# Patient Record
Sex: Female | Born: 1985 | Race: Black or African American | Hispanic: No | Marital: Single | Smoking: Never smoker
Health system: Southern US, Community
[De-identification: ages and names within clinical notes are randomized; demographics above are authoritative.]

## PROBLEM LIST (undated history)

## (undated) DIAGNOSIS — Z789 Other specified health status: Secondary | ICD-10-CM

## (undated) DIAGNOSIS — O24419 Gestational diabetes mellitus in pregnancy, unspecified control: Secondary | ICD-10-CM

## (undated) HISTORY — PX: BILATERAL SALPINGECTOMY: SHX5743

---

## 2019-09-22 ENCOUNTER — Emergency Department (HOSPITAL_COMMUNITY)
Admission: EM | Admit: 2019-09-22 | Discharge: 2019-09-23 | Disposition: A | Discharge status: Home / Self Care | Payer: Medicaid - Out of State | Attending: Emergency Medicine | Admitting: Emergency Medicine

## 2019-09-22 ENCOUNTER — Other Ambulatory Visit: Payer: Self-pay

## 2019-09-22 ENCOUNTER — Emergency Department (HOSPITAL_COMMUNITY)
Admission: EM | Admit: 2019-09-22 | Discharge: 2019-09-22 | Disposition: A | Discharge status: Home / Self Care | Payer: Medicaid - Out of State

## 2019-09-22 ENCOUNTER — Ambulatory Visit (HOSPITAL_COMMUNITY): Admission: EM | Admit: 2019-09-22 | Discharge: 2019-09-22 | Discharge status: Against Medical Advice | Payer: Self-pay

## 2019-09-22 ENCOUNTER — Encounter (HOSPITAL_COMMUNITY): Payer: Self-pay | Admitting: Emergency Medicine

## 2019-09-22 DIAGNOSIS — M545 Low back pain, unspecified: Secondary | ICD-10-CM

## 2019-09-22 DIAGNOSIS — R110 Nausea: Secondary | ICD-10-CM

## 2019-09-22 DIAGNOSIS — R0981 Nasal congestion: Secondary | ICD-10-CM | POA: Insufficient documentation

## 2019-09-22 NOTE — ED Triage Notes (Signed)
Pt endorses N/V and HA today. Denies abd pain. Able to tolerate PO.

## 2019-09-23 LAB — URINALYSIS, ROUTINE W REFLEX MICROSCOPIC
Bacteria, UA: NONE SEEN
Bilirubin Urine: NEGATIVE
Glucose, UA: NEGATIVE mg/dL
Ketones, ur: NEGATIVE mg/dL
Leukocytes,Ua: NEGATIVE
Nitrite: NEGATIVE
Protein, ur: NEGATIVE mg/dL
RBC / HPF: >50 RBC/hpf — ABNORMAL HIGH (ref 0–5)
Specific Gravity, Urine: 1.025 (ref 1.005–1.030)
pH: 5 (ref 5.0–8.0)

## 2019-09-23 LAB — POC URINE PREG, ED: Preg Test, Ur: NEGATIVE

## 2019-09-23 MED ORDER — ONDANSETRON 4 MG PO TBDP: 4.0000 mg | ORAL_TABLET | Freq: Three times a day (TID) | ORAL | 0 refills | Status: AC | PRN

## 2019-09-23 MED ORDER — FLUTICASONE PROPIONATE 50 MCG/ACT NA SUSP: 1.0000 | Freq: Every day | NASAL | 0 refills | Status: AC

## 2019-09-23 MED ORDER — ACETAMINOPHEN 500 MG PO TABS
1000.0000 mg | ORAL_TABLET | Freq: Once | ORAL | Status: AC
  Administered 2019-09-23: 01:00:00 1000 mg via ORAL
  Filled 2019-09-23: qty 2

## 2019-09-23 MED ORDER — METHOCARBAMOL 500 MG PO TABS: 500.0000 mg | ORAL_TABLET | Freq: Two times a day (BID) | ORAL | 0 refills | Status: DC | PRN

## 2019-09-23 MED ORDER — ONDANSETRON 4 MG PO TBDP
4.0000 mg | ORAL_TABLET | Freq: Once | ORAL | Status: AC
  Administered 2019-09-23: 01:00:00 4 mg via ORAL
  Filled 2019-09-23: qty 1

## 2019-09-23 NOTE — ED Notes (Signed)
Tolerated PO challenge.

## 2019-09-23 NOTE — ED Provider Notes (Signed)
[name] EMERGENC[facility] DEPARTMENT Provider Note   CSN: [id number] Arrival date & time: 09/22/19  1745     History Chief Complaint  Patient presents with  . Nausea    Sheri Stout is a 34 y.o. female.  The history is provided by the patient and medical records.   34 year old with nausea, headache, and low back pain.  Symptoms ongoing for the past week or so. States headache is dull in the middle of her forehead.  She denies any dizziness, confusion, numbness or weakness.  She has not had any fever, chills, or neck pain.  She was seen at urgent care last week for same with negative COVID test.  She was not given any medications for congestion.  Back pain is more in her lower back, very vague in description.  She has not had any radiation into the legs.  No bowel or bladder incontinence.  Does report doing a lot of bending and lifting at work which may be contributing.  She denies any current urinary symptoms but does report history of frequent UTI's. No pelvic pain or vaginal discharge. Denies sick contacts, changes in diet, abnormal food intake, or other source of nausea.  She has been able to tolerate food and fluids today.  Denies abdominal pain.  No meds PTA.  History reviewed. No pertinent past medical history.  There are no problems to display for this patient.   History reviewed. No pertinent surgical history.   OB History   No obstetric history on file.     No family history on file.  Social History   Tobacco Use  . Smoking status: Not on file  Substance Use Topics  . Alcohol use: Not on file  . Drug use: Not on file    Home Medications Prior to Admission medications   Not on File    Allergies    Patient has no allergy information on record.  Review of Systems   Review of Systems  Gastrointestinal: Positive for nausea.  All other systems reviewed and are negative.   Physical Exam Updated Vital Signs BP 108/73 (BP Location: Left Arm)    Pulse 78   Temp 98.1 F (36.7 C) (Oral)   Resp 18   Ht 5\' 6"  (1.676 m)   Wt 79.4 kg   LMP 09/22/2019   SpO2 99%   BMI 28.25 kg/m   Physical Exam Vitals and nursing note reviewed.  Constitutional:      Appearance: She is well-developed.     Comments: Clinically appears well  HENT:     Head: Normocephalic and atraumatic.     Nose: Congestion present.     Comments: Sounds congested Eyes:     Conjunctiva/sclera: Conjunctivae normal.     Pupils: Pupils are equal, round, and reactive to light.  Cardiovascular:     Rate and Rhythm: Normal rate and regular rhythm.     Heart sounds: Normal heart sounds.  Pulmonary:     Effort: Pulmonary effort is normal.     Breath sounds: Normal breath sounds.  Abdominal:     General: Bowel sounds are normal.     Palpations: Abdomen is soft.     Tenderness: There is no abdominal tenderness. There is no guarding or rebound.     Comments: Soft, non-tender, no CVA tenderness  Musculoskeletal:        General: Normal range of motion.     Cervical back: Normal range of motion.     Comments: No midline  tenderness or step-off of the lumbar spine, no muscular tenderness or spasm appreciated, full ROM maintained, normal strength/sensation of both legs, normal gait  Skin:    General: Skin is warm and dry.  Neurological:     Mental Status: She is alert and oriented to person, place, and time.     ED Results / Procedures / Treatments   Labs (all labs ordered are listed, but only abnormal results are displayed) Labs Reviewed  URINALYSIS, ROUTINE W REFLEX MICROSCOPIC - Abnormal; Notable for the following components:      Result Value   APPearance HAZY (*)    Hgb urine dipstick LARGE (*)    RBC / HPF >50 (*)    All other components within normal limits  POC URINE PREG, ED    EKG None  Radiology No results found.  Procedures Procedures (including critical care time)  Medications Ordered in ED Medications  ondansetron (ZOFRAN-ODT)  disintegrating tablet 4 mg (4 mg Oral Given 09/23/19 0117)  acetaminophen (TYLENOL) tablet 1,000 mg (1,000 mg Oral Given 09/23/19 0117)    ED Course  I have reviewed the triage vital signs and the nursing notes.  Pertinent labs & imaging results that were available during my care of the patient were reviewed by me and considered in my medical decision making (see chart for details).    MDM Rules/Calculators/A&P  34 year old female here with several complaints.  1.  Nausea and headache-- ongoing for the past week or so.  Seen at urgent care and had covid test that was negative.  Reports some nasal congestion with this.  Denies fever or further sick contacts.  Does have some congestion on exam but lungs are clear bilaterally.  No active emesis observed while here in the waiting room for past several hours.  Given dose of zofran and tylenol.  2.  Back pain-- ongoing for a few days.  She did recently move to [place] from [place] and her job is physical requiring a lot of bending and lifting.  No focal neurologic deficits observed here.  She is ambulatory with steady gait.  No red flag symptoms.  Denies any urinary symptoms, pelvic pain, or abdominal pain.  Does have history of recurrent UTI.  UA here with some blood but no bacteria.  Pregnancy test is negative.  Likely musculoskeletal.  1:53 AM Nausea is significantly better after Zofran.  She has been able to tolerate 2 cups of water without issue.  She not had any active emesis here in the emergency department.  Feel she is stable for discharge home.  Will prescribe some medications to help with congestion as well as back pain.  She will need to follow-up closely with primary care doctor who is still in [place].  Given work note.  She will return here for any new or acute changes.   Final Clinical Impression(s) / ED Diagnoses Final diagnoses:  Nausea  Nasal congestion  Acute low back pain without sciatica, unspecified back pain laterality    Rx / DC  Orders ED Discharge Orders         Ordered    fluticasone (FLONASE) 50 MCG/ACT nasal spray  Daily     09/23/19 0154    methocarbamol (ROBAXIN) 500 MG tablet  2 times daily PRN     09/23/19 0154    ondansetron (ZOFRAN ODT) 4 MG disintegrating tablet  Every 8 hours PRN     09/23/19 0154           Sharilyn Sites  Blair Heys 09/23/19 [id number]    Shon Baton, MD 09/23/19 [phone]

## 2019-09-23 NOTE — ED Notes (Signed)
Pt given water 

## 2019-09-23 NOTE — Discharge Instructions (Signed)
Take the prescribed medication as directed.  Make sure to stay hydrated. Follow-up with your primary care doctor. Return to the ED for new or worsening symptoms.

## 2019-09-23 NOTE — ED Notes (Signed)
Verbalized understanding of DC instructions, Rx, follow up care 

## 2020-07-23 ENCOUNTER — Encounter (HOSPITAL_COMMUNITY): Payer: Self-pay

## 2020-07-23 ENCOUNTER — Other Ambulatory Visit: Payer: Self-pay

## 2020-07-23 ENCOUNTER — Emergency Department (HOSPITAL_COMMUNITY): Payer: Medicaid - Out of State

## 2020-07-23 ENCOUNTER — Emergency Department (HOSPITAL_COMMUNITY)
Admission: EM | Admit: 2020-07-23 | Discharge: 2020-07-23 | Disposition: A | Discharge status: Home / Self Care | Payer: Medicaid - Out of State | Attending: Emergency Medicine | Admitting: Emergency Medicine

## 2020-07-23 DIAGNOSIS — S22049A Unspecified fracture of fourth thoracic vertebra, initial encounter for closed fracture: Secondary | ICD-10-CM | POA: Diagnosis not present

## 2020-07-23 DIAGNOSIS — S29002A Unspecified injury of muscle and tendon of back wall of thorax, initial encounter: Secondary | ICD-10-CM | POA: Diagnosis present

## 2020-07-23 DIAGNOSIS — R519 Headache, unspecified: Secondary | ICD-10-CM | POA: Insufficient documentation

## 2020-07-23 DIAGNOSIS — M25521 Pain in right elbow: Secondary | ICD-10-CM | POA: Insufficient documentation

## 2020-07-23 DIAGNOSIS — Y92410 Unspecified street and highway as the place of occurrence of the external cause: Secondary | ICD-10-CM | POA: Diagnosis not present

## 2020-07-23 LAB — I-STAT BETA HCG BLOOD, ED (MC, WL, AP ONLY): I-stat hCG, quantitative: <5 m[IU]/mL (ref <5)

## 2020-07-23 MED ORDER — NAPROXEN 500 MG PO TABS: 500.0000 mg | ORAL_TABLET | Freq: Two times a day (BID) | ORAL | 0 refills | Status: DC

## 2020-07-23 MED ORDER — NAPROXEN 500 MG PO TABS: 500.0000 mg | ORAL_TABLET | Freq: Two times a day (BID) | ORAL | 0 refills | Status: AC

## 2020-07-23 MED ORDER — METHOCARBAMOL 500 MG PO TABS: 500.0000 mg | ORAL_TABLET | Freq: Two times a day (BID) | ORAL | 0 refills | Status: DC

## 2020-07-23 MED ORDER — OXYCODONE-ACETAMINOPHEN 5-325 MG PO TABS
1.0000 | ORAL_TABLET | Freq: Once | ORAL | Status: AC
  Administered 2020-07-23: 1 via ORAL
  Filled 2020-07-23: qty 1

## 2020-07-23 MED ORDER — HYDROCODONE-ACETAMINOPHEN 5-325 MG PO TABS: 1.0000 | ORAL_TABLET | Freq: Four times a day (QID) | ORAL | 0 refills | Status: DC | PRN

## 2020-07-23 MED ORDER — LIDOCAINE 5 % EX PTCH
1.0000 | MEDICATED_PATCH | CUTANEOUS | Status: DC
  Administered 2020-07-23: 1 via TRANSDERMAL
  Filled 2020-07-23: qty 1
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
## 2020-07-23 NOTE — Progress Notes (Signed)
Orthopedic Tech Progress Note Patient Details:  Sheri Stout 1985-11-14 [id number]  Ortho Devices Type of Ortho Device: Sling and swathe Ortho Device/Splint Location: right Ortho Device/Splint Interventions: Application   Post Interventions Patient Tolerated: Well Instructions Provided: Care of device   Saul Fordyce 07/23/2020, 7:17 PM
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
## 2020-07-23 NOTE — ED Notes (Signed)
Patient left before discharge process could be completed.  

## 2020-07-24 ENCOUNTER — Emergency Department (HOSPITAL_COMMUNITY)
Admission: EM | Admit: 2020-07-24 | Discharge: 2020-07-24 | Disposition: A | Discharge status: Home / Self Care | Payer: Medicaid - Out of State | Attending: Emergency Medicine | Admitting: Emergency Medicine

## 2020-07-24 ENCOUNTER — Encounter (HOSPITAL_COMMUNITY): Payer: Self-pay | Admitting: Emergency Medicine

## 2020-07-24 ENCOUNTER — Other Ambulatory Visit: Payer: Self-pay

## 2020-07-24 ENCOUNTER — Emergency Department (HOSPITAL_COMMUNITY): Payer: Medicaid - Out of State

## 2020-07-24 DIAGNOSIS — M542 Cervicalgia: Secondary | ICD-10-CM | POA: Insufficient documentation

## 2020-07-24 DIAGNOSIS — R0789 Other chest pain: Secondary | ICD-10-CM | POA: Diagnosis not present

## 2020-07-24 DIAGNOSIS — S161XXA Strain of muscle, fascia and tendon at neck level, initial encounter: Secondary | ICD-10-CM

## 2020-07-24 DIAGNOSIS — R519 Headache, unspecified: Secondary | ICD-10-CM | POA: Insufficient documentation

## 2020-07-24 DIAGNOSIS — Y92410 Unspecified street and highway as the place of occurrence of the external cause: Secondary | ICD-10-CM | POA: Insufficient documentation

## 2020-07-24 DIAGNOSIS — R42 Dizziness and giddiness: Secondary | ICD-10-CM | POA: Diagnosis not present

## 2020-07-24 DIAGNOSIS — S060X0A Concussion without loss of consciousness, initial encounter: Secondary | ICD-10-CM

## 2020-07-24 MED ORDER — KETOROLAC TROMETHAMINE 60 MG/2ML IM SOLN
60.0000 mg | Freq: Once | INTRAMUSCULAR | Status: AC
  Administered 2020-07-24: 60 mg via INTRAMUSCULAR
  Filled 2020-07-24: qty 2

## 2020-07-24 MED ORDER — MORPHINE SULFATE (PF) 4 MG/ML IV SOLN
4.0000 mg | Freq: Once | INTRAVENOUS | Status: AC
  Administered 2020-07-24: 4 mg via INTRAMUSCULAR
  Filled 2020-07-24: qty 1

## 2020-07-24 MED ORDER — CYCLOBENZAPRINE HCL 10 MG PO TABS
5.0000 mg | ORAL_TABLET | Freq: Once | ORAL | Status: AC
  Administered 2020-07-24: 5 mg via ORAL
  Filled 2020-07-24: qty 1

## 2020-07-24 NOTE — Discharge Instructions (Addendum)
You can continue taking your Vicodin and Flexeril as prescribed yesterday.  Your CT head and neck were unremarkable today.  You likely just have concussion and neck strain.  Please follow-up with Dr. Jake Samples from neurosurgery as discussed yesterday   Return to ER if you have worse neck pain, headaches, vomiting
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
## 2020-08-02 ENCOUNTER — Other Ambulatory Visit: Payer: Self-pay

## 2020-08-02 ENCOUNTER — Encounter (HOSPITAL_COMMUNITY): Payer: Self-pay

## 2020-08-02 ENCOUNTER — Emergency Department (HOSPITAL_COMMUNITY)
Admission: EM | Admit: 2020-08-02 | Discharge: 2020-08-03 | Disposition: A | Discharge status: Home / Self Care | Payer: Medicaid - Out of State | Attending: Emergency Medicine | Admitting: Emergency Medicine

## 2020-08-02 DIAGNOSIS — S22040D Wedge compression fracture of fourth thoracic vertebra, subsequent encounter for fracture with routine healing: Secondary | ICD-10-CM | POA: Insufficient documentation

## 2020-08-02 DIAGNOSIS — M549 Dorsalgia, unspecified: Secondary | ICD-10-CM | POA: Diagnosis present

## 2020-08-02 DIAGNOSIS — S22048D Other fracture of fourth thoracic vertebra, subsequent encounter for fracture with routine healing: Secondary | ICD-10-CM

## 2020-08-02 DIAGNOSIS — M62830 Muscle spasm of back: Secondary | ICD-10-CM
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
## 2020-08-03 MED ORDER — METHOCARBAMOL 500 MG PO TABS: 500.0000 mg | ORAL_TABLET | Freq: Two times a day (BID) | ORAL | 0 refills | Status: AC

## 2020-08-03 MED ORDER — KETOROLAC TROMETHAMINE 60 MG/2ML IM SOLN
30.0000 mg | Freq: Once | INTRAMUSCULAR | Status: AC
  Administered 2020-08-03: 30 mg via INTRAMUSCULAR
  Filled 2020-08-03: qty 2

## 2020-08-03 NOTE — Discharge Instructions (Addendum)
You may use over-the-counter Acetaminophen (Tylenol)[1000mg  every 8 hours], topical muscle creams such as SalonPas, [employer], Bengay, etc. Please stretch, apply ice or heat (whichever helps), and have massage therapy for additional assistance.
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
## 2020-12-11 ENCOUNTER — Emergency Department (HOSPITAL_COMMUNITY): Payer: Medicaid - Out of State

## 2020-12-11 ENCOUNTER — Ambulatory Visit (HOSPITAL_COMMUNITY)
Admission: EM | Admit: 2020-12-11 | Discharge: 2020-12-11 | Disposition: A | Discharge status: Home / Self Care | Payer: Medicaid - Out of State

## 2020-12-11 ENCOUNTER — Emergency Department (HOSPITAL_COMMUNITY)
Admission: EM | Admit: 2020-12-11 | Discharge: 2020-12-12 | Disposition: A | Discharge status: Home / Self Care | Payer: Medicaid - Out of State | Attending: Emergency Medicine | Admitting: Emergency Medicine

## 2020-12-11 ENCOUNTER — Other Ambulatory Visit: Payer: Self-pay

## 2020-12-11 ENCOUNTER — Encounter (HOSPITAL_COMMUNITY): Payer: Self-pay | Admitting: Emergency Medicine

## 2020-12-11 DIAGNOSIS — F17210 Nicotine dependence, cigarettes, uncomplicated: Secondary | ICD-10-CM | POA: Diagnosis not present

## 2020-12-11 DIAGNOSIS — Z23 Encounter for immunization: Secondary | ICD-10-CM | POA: Diagnosis not present

## 2020-12-11 DIAGNOSIS — S71111A Laceration without foreign body, right thigh, initial encounter: Secondary | ICD-10-CM | POA: Insufficient documentation

## 2020-12-11 DIAGNOSIS — W268XXA Contact with other sharp object(s), not elsewhere classified, initial encounter: Secondary | ICD-10-CM | POA: Insufficient documentation

## 2020-12-11 DIAGNOSIS — S79921A Unspecified injury of right thigh, initial encounter: Secondary | ICD-10-CM | POA: Diagnosis present

## 2020-12-11 DIAGNOSIS — T148XXA Other injury of unspecified body region, initial encounter: Secondary | ICD-10-CM

## 2020-12-11 MED ORDER — TETANUS-DIPHTH-ACELL PERTUSSIS 5-2.5-18.5 LF-MCG/0.5 IM SUSY
0.5000 mL | PREFILLED_SYRINGE | Freq: Once | INTRAMUSCULAR | Status: AC
  Administered 2020-12-12: 0.5 mL via INTRAMUSCULAR
  Filled 2020-12-11: qty 0.5

## 2020-12-11 MED ORDER — OXYCODONE-ACETAMINOPHEN 5-325 MG PO TABS
1.0000 | ORAL_TABLET | Freq: Once | ORAL | Status: AC
  Administered 2020-12-11: 1 via ORAL
  Filled 2020-12-11: qty 1

## 2020-12-11 MED ORDER — IBUPROFEN 400 MG PO TABS
400.0000 mg | ORAL_TABLET | Freq: Once | ORAL | Status: AC
  Administered 2020-12-11: 400 mg via ORAL
  Filled 2020-12-11: qty 1
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
## 2020-12-11 NOTE — ED Notes (Signed)
Pt states she is stepping outside for fresh air
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
## 2020-12-12 MED ORDER — LIDOCAINE HCL (PF) 1 % IJ SOLN
30.0000 mL | Freq: Once | INTRAMUSCULAR | Status: AC
  Administered 2020-12-12: 30 mL
  Filled 2020-12-12: qty 30

## 2020-12-12 MED ORDER — OXYCODONE-ACETAMINOPHEN 5-325 MG PO TABS
1.0000 | ORAL_TABLET | Freq: Once | ORAL | Status: AC
  Administered 2020-12-12: 1 via ORAL
  Filled 2020-12-12: qty 1
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
## 2020-12-12 NOTE — Discharge Instructions (Addendum)
Sutures will need to be removed in about 1 week.  Try to keep clean and dry until this time. Can follow-up with urgent care to have these removed. Return here for new concerns.

## 2021-01-04 ENCOUNTER — Encounter (HOSPITAL_COMMUNITY): Payer: Self-pay | Admitting: Emergency Medicine

## 2021-01-04 ENCOUNTER — Other Ambulatory Visit: Payer: Self-pay

## 2021-01-04 ENCOUNTER — Ambulatory Visit (HOSPITAL_COMMUNITY)
Admission: EM | Admit: 2021-01-04 | Discharge: 2021-01-04 | Disposition: A | Discharge status: Home / Self Care | Payer: Medicaid - Out of State | Attending: Internal Medicine | Admitting: Internal Medicine

## 2021-01-04 DIAGNOSIS — Z4802 Encounter for removal of sutures: Secondary | ICD-10-CM

## 2021-01-04 NOTE — ED Triage Notes (Signed)
Pt presents for suture removal. Denies any pain or signs of infection. Stitches were placed on 12/11/20. Okayed by Ignacia Bayley, PA to removed stiches.

## 2021-07-19 ENCOUNTER — Other Ambulatory Visit: Payer: Self-pay

## 2021-07-19 ENCOUNTER — Emergency Department (HOSPITAL_COMMUNITY)
Admission: EM | Admit: 2021-07-19 | Discharge: 2021-07-19 | Disposition: A | Discharge status: Home / Self Care | Payer: Self-pay | Attending: Emergency Medicine | Admitting: Emergency Medicine

## 2021-07-19 ENCOUNTER — Encounter (HOSPITAL_COMMUNITY): Payer: Self-pay | Admitting: *Deleted

## 2021-07-19 DIAGNOSIS — T7491XA Unspecified adult maltreatment, confirmed, initial encounter: Secondary | ICD-10-CM

## 2021-07-19 DIAGNOSIS — Z6911 Encounter for mental health services for victim of spousal or partner abuse: Secondary | ICD-10-CM | POA: Insufficient documentation

## 2021-07-19 DIAGNOSIS — S61217A Laceration without foreign body of left little finger without damage to nail, initial encounter: Secondary | ICD-10-CM | POA: Insufficient documentation

## 2021-07-19 DIAGNOSIS — W268XXA Contact with other sharp object(s), not elsewhere classified, initial encounter: Secondary | ICD-10-CM | POA: Insufficient documentation

## 2021-07-19 MED ORDER — LIDOCAINE HCL (PF) 1 % IJ SOLN
5.0000 mL | Freq: Once | INTRAMUSCULAR | Status: AC
  Administered 2021-07-19: 5 mL via INTRADERMAL
  Filled 2021-07-19: qty 30
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
## 2021-07-19 NOTE — ED Notes (Signed)
Pt left prior to returning with d/c paperwork.
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
## 2023-01-16 ENCOUNTER — Ambulatory Visit: Payer: Medicaid Other

## 2023-02-19 LAB — PANORAMA PRENATAL TEST FULL PANEL:PANORAMA TEST PLUS 5 ADDITIONAL MICRODELETIONS: FETAL FRACTION: 4.3

## 2023-08-03 ENCOUNTER — Inpatient Hospital Stay (HOSPITAL_COMMUNITY)
Admission: AD | Admit: 2023-08-03 | Discharge: 2023-08-05 | DRG: 776 | Disposition: A | Discharge status: Home / Self Care | Payer: Medicaid Other | Attending: Obstetrics and Gynecology | Admitting: Obstetrics and Gynecology

## 2023-08-03 ENCOUNTER — Other Ambulatory Visit: Payer: Self-pay

## 2023-08-03 ENCOUNTER — Encounter (HOSPITAL_COMMUNITY): Payer: Self-pay | Admitting: Obstetrics and Gynecology

## 2023-08-03 ENCOUNTER — Inpatient Hospital Stay (HOSPITAL_COMMUNITY): Payer: Medicaid Other

## 2023-08-03 DIAGNOSIS — R600 Localized edema: Secondary | ICD-10-CM

## 2023-08-03 DIAGNOSIS — J810 Acute pulmonary edema: Secondary | ICD-10-CM | POA: Diagnosis present

## 2023-08-03 DIAGNOSIS — O165 Unspecified maternal hypertension, complicating the puerperium: Secondary | ICD-10-CM | POA: Diagnosis present

## 2023-08-03 DIAGNOSIS — O9081 Anemia of the puerperium: Secondary | ICD-10-CM | POA: Diagnosis present

## 2023-08-03 DIAGNOSIS — O9953 Diseases of the respiratory system complicating the puerperium: Secondary | ICD-10-CM | POA: Diagnosis present

## 2023-08-03 DIAGNOSIS — Z98891 History of uterine scar from previous surgery: Secondary | ICD-10-CM

## 2023-08-03 DIAGNOSIS — J101 Influenza due to other identified influenza virus with other respiratory manifestations: Secondary | ICD-10-CM | POA: Diagnosis present

## 2023-08-03 DIAGNOSIS — R0602 Shortness of breath: Secondary | ICD-10-CM | POA: Diagnosis present

## 2023-08-03 DIAGNOSIS — I517 Cardiomegaly: Secondary | ICD-10-CM | POA: Diagnosis not present

## 2023-08-03 DIAGNOSIS — J811 Chronic pulmonary edema: Secondary | ICD-10-CM | POA: Diagnosis present

## 2023-08-03 DIAGNOSIS — L7682 Other postprocedural complications of skin and subcutaneous tissue: Secondary | ICD-10-CM

## 2023-08-03 HISTORY — DX: Gestational diabetes mellitus in pregnancy, unspecified control: O24.419

## 2023-08-03 HISTORY — DX: Other specified health status: Z78.9

## 2023-08-03 LAB — CBC
HCT: 29.2 % — ABNORMAL LOW (ref 36.0–46.0)
Hemoglobin: 8.8 g/dL — ABNORMAL LOW (ref 12.0–15.0)
MCH: 26.3 pg (ref 26.0–34.0)
MCHC: 30.1 g/dL (ref 30.0–36.0)
MCV: 87.4 fL (ref 80.0–100.0)
Platelets: 377 10*3/uL (ref 150–400)
RBC: 3.34 MIL/uL — ABNORMAL LOW (ref 3.87–5.11)
RDW: 16.3 % — ABNORMAL HIGH (ref 11.5–15.5)
WBC: 7.9 10*3/uL (ref 4.0–10.5)
nRBC: 0 % (ref 0.0–0.2)

## 2023-08-03 LAB — COMPREHENSIVE METABOLIC PANEL
ALT: 20 U/L (ref 0–44)
AST: 31 U/L (ref 15–41)
Albumin: 2.4 g/dL — ABNORMAL LOW (ref 3.5–5.0)
Alkaline Phosphatase: 53 U/L (ref 38–126)
Anion gap: 8 (ref 5–15)
BUN: 14 mg/dL (ref 6–20)
CO2: 24 mmol/L (ref 22–32)
Calcium: 8.7 mg/dL — ABNORMAL LOW (ref 8.9–10.3)
Chloride: 109 mmol/L (ref 98–111)
Creatinine, Ser: 0.96 mg/dL (ref 0.44–1.00)
GFR, Estimated: >60 mL/min (ref >60)
Glucose, Bld: 86 mg/dL (ref 70–99)
Potassium: 4.1 mmol/L (ref 3.5–5.1)
Sodium: 141 mmol/L (ref 135–145)
Total Bilirubin: 0.4 mg/dL (ref 0.0–1.2)
Total Protein: 5.6 g/dL — ABNORMAL LOW (ref 6.5–8.1)

## 2023-08-03 MED ORDER — ZOLPIDEM TARTRATE 5 MG PO TABS
5.0000 mg | ORAL_TABLET | Freq: Every evening | ORAL | Status: DC | PRN
  Administered 2023-08-03: 5 mg via ORAL
  Filled 2023-08-03: qty 1

## 2023-08-03 MED ORDER — SIMETHICONE 80 MG PO CHEW: 80.0000 mg | CHEWABLE_TABLET | ORAL | Status: DC | PRN

## 2023-08-03 MED ORDER — FERROUS SULFATE 325 (65 FE) MG PO TABS
325.0000 mg | ORAL_TABLET | ORAL | Status: DC
  Administered 2023-08-03 – 2023-08-05 (×2): 325 mg via ORAL
  Filled 2023-08-03 (×2): qty 1

## 2023-08-03 MED ORDER — IOHEXOL 350 MG/ML SOLN
65.0000 mL | Freq: Once | INTRAVENOUS | Status: AC | PRN
  Administered 2023-08-03: 65 mL via INTRAVENOUS

## 2023-08-03 MED ORDER — NIFEDIPINE 10 MG PO CAPS: 20.0000 mg | ORAL_CAPSULE | ORAL | Status: DC | PRN

## 2023-08-03 MED ORDER — NIFEDIPINE 10 MG PO CAPS: 10.0000 mg | ORAL_CAPSULE | ORAL | Status: DC | PRN

## 2023-08-03 MED ORDER — BENZOCAINE-MENTHOL 20-0.5 % EX AERO: 1.0000 | INHALATION_SPRAY | CUTANEOUS | Status: DC | PRN

## 2023-08-03 MED ORDER — DIPHENHYDRAMINE HCL 25 MG PO CAPS: 25.0000 mg | ORAL_CAPSULE | Freq: Four times a day (QID) | ORAL | Status: DC | PRN

## 2023-08-03 MED ORDER — ENOXAPARIN SODIUM 60 MG/0.6ML IJ SOSY
60.0000 mg | PREFILLED_SYRINGE | INTRAMUSCULAR | Status: DC
  Administered 2023-08-03 – 2023-08-04 (×2): 60 mg via SUBCUTANEOUS
  Filled 2023-08-03 (×2): qty 0.6

## 2023-08-03 MED ORDER — HYDROMORPHONE HCL 1 MG/ML IJ SOLN
1.0000 mg | Freq: Once | INTRAMUSCULAR | Status: AC
  Administered 2023-08-03: 1 mg via INTRAVENOUS
  Filled 2023-08-03: qty 1

## 2023-08-03 MED ORDER — PRENATAL MULTIVITAMIN CH
1.0000 | ORAL_TABLET | Freq: Every day | ORAL | Status: DC
  Filled 2023-08-03: qty 1

## 2023-08-03 MED ORDER — FUROSEMIDE 20 MG PO TABS
20.0000 mg | ORAL_TABLET | Freq: Two times a day (BID) | ORAL | Status: DC
  Administered 2023-08-04 – 2023-08-05 (×3): 20 mg via ORAL
  Filled 2023-08-03 (×3): qty 1

## 2023-08-03 MED ORDER — TETANUS-DIPHTH-ACELL PERTUSSIS 5-2.5-18.5 LF-MCG/0.5 IM SUSY: 0.5000 mL | PREFILLED_SYRINGE | Freq: Once | INTRAMUSCULAR | Status: DC

## 2023-08-03 MED ORDER — FUROSEMIDE 20 MG PO TABS
20.0000 mg | ORAL_TABLET | Freq: Every day | ORAL | Status: DC
Start: 2023-08-03 — End: 2023-08-03
  Administered 2023-08-03: 20 mg via ORAL
  Filled 2023-08-03: qty 1

## 2023-08-03 MED ORDER — COCONUT OIL OIL: 1.0000 | TOPICAL_OIL | Status: DC | PRN

## 2023-08-03 MED ORDER — FUROSEMIDE 10 MG/ML IJ SOLN
20.0000 mg | Freq: Once | INTRAMUSCULAR | Status: AC
  Administered 2023-08-03: 20 mg via INTRAVENOUS
  Filled 2023-08-03: qty 2

## 2023-08-03 MED ORDER — IBUPROFEN 600 MG PO TABS
600.0000 mg | ORAL_TABLET | Freq: Four times a day (QID) | ORAL | Status: DC
  Administered 2023-08-03 – 2023-08-05 (×7): 600 mg via ORAL
  Filled 2023-08-03 (×8): qty 1

## 2023-08-03 MED ORDER — SENNOSIDES-DOCUSATE SODIUM 8.6-50 MG PO TABS
2.0000 | ORAL_TABLET | ORAL | Status: DC
  Administered 2023-08-04: 2 via ORAL
  Filled 2023-08-03 (×2): qty 2

## 2023-08-03 MED ORDER — NIFEDIPINE ER OSMOTIC RELEASE 30 MG PO TB24
30.0000 mg | ORAL_TABLET | Freq: Every day | ORAL | Status: DC
  Administered 2023-08-03 – 2023-08-04 (×2): 30 mg via ORAL
  Filled 2023-08-03 (×2): qty 1

## 2023-08-03 MED ORDER — WITCH HAZEL-GLYCERIN EX PADS: 1.0000 | MEDICATED_PAD | CUTANEOUS | Status: DC | PRN

## 2023-08-03 MED ORDER — ACETAMINOPHEN 325 MG PO TABS
650.0000 mg | ORAL_TABLET | ORAL | Status: DC | PRN
  Administered 2023-08-04 (×3): 650 mg via ORAL
  Filled 2023-08-03 (×3): qty 2

## 2023-08-03 MED ORDER — DIBUCAINE (PERIANAL) 1 % EX OINT: 1.0000 | TOPICAL_OINTMENT | CUTANEOUS | Status: DC | PRN

## 2023-08-03 MED ORDER — ONDANSETRON HCL 4 MG PO TABS: 4.0000 mg | ORAL_TABLET | ORAL | Status: DC | PRN

## 2023-08-03 MED ORDER — LABETALOL HCL 5 MG/ML IV SOLN: 40.0000 mg | INTRAVENOUS | Status: DC | PRN

## 2023-08-03 MED ORDER — ONDANSETRON HCL 4 MG/2ML IJ SOLN: 4.0000 mg | INTRAMUSCULAR | Status: DC | PRN

## 2023-08-03 NOTE — MAU Note (Signed)
Sheri Stout is a 38 y.o. at [facility] here in [facility]U reporting: she's having difficulty breathing when lying down and with activity.  Reports the SOB started 2 days ago and worsened yesterday.  Reports she is also concerned about the swelling in bilateral feet and legs, reports they are tight and hurt.   S/P Cesarean Section 07/25/2023 in [place].  LMP: NA Onset of complaint: 2 days ago Pain score: 8 Vitals:   08/03/23 0935  BP: (!) 149/95  Pulse: 84  Resp: 18  Temp: 97.6 F (36.4 C)  SpO2: 97%     FHT: NA  Lab orders placed from triage: None

## 2023-08-03 NOTE — H&P (Signed)
Colman Cater, NP Nurse Practitioner Obstetrics   MAU Provider Note     Cosign Needed   Date of Service: 08/03/2023 10:11 AM   Cosign Needed     Expand All Collapse All  Chief Complaint:  Shortness of Breath and Swelling     HPI  POSTPARTUM   Sheri Stout is a 38 y.o. [id number] at Unknown who presents to maternity admissions reporting she is postpartum s/p Emergent C- Section at 36.4 weeks on 07/25/23 [facility]  for Non reassuring antenatal testing with a BPP 4/8. Patient reports her pregnancy was complicated by GDM and gHTN. She reports she was only taking Metformin and no medications for hypertension. She denies receiving any antihypertensives while in labor or postpartum. Has only taken Ibuprofen for incisional pain. She noticed after discharge on 07/28/23 she started to develop PP swelling in her lower extremities and having incisional pain in her abdomen. Recently her symptoms have progressed to SOB x x 2 days at rest. She is unable to perform normal ADL's without feeling SOB. . She rates her overall pain as 9/10 on pain scale which includes PP swelling, SOB, and incisional pain.    Pregnancy Course: [employer] - [facility]       Past Medical History:  Diagnosis Date   Gestational diabetes     Medical history non-contributory                           OB History  Gravida Para Term Preterm AB Living   6 5 4 1 1 5    SAB IAB Ectopic Multiple Live Births        1     5         # Outcome Date GA Lbr Len/2nd Weight Sex Type Anes PTL Lv  6 IAB                    5 Preterm                    4 Term                    3 Term                    2 Term                    1 Term                           Past Surgical History:  Procedure Laterality Date   CESAREAN SECTION                 Family History  Problem Relation Age of Onset   Kidney failure Father          Social History  Social History         Tobacco Use   Smoking status: Never   Smokeless  tobacco: Never  Vaping Use   Vaping status: Never Used  Substance Use Topics   Alcohol use: Yes      Comment: Social   Drug use: Never      Allergies  No Known Allergies          Medications Prior to Admission  Medication Sig Dispense Refill Last Dose/Taking   HYDROcodone-acetaminophen (NORCO/VICODIN) 5-325 MG tablet Take 1 tablet by mouth every 6 (six)  hours as needed for severe pain. 10 tablet 0 08/02/2023   ibuprofen (ADVIL) 800 MG tablet Take 800 mg by mouth every 8 (eight) hours as needed.     08/02/2023   fluticasone (FLONASE) 50 MCG/ACT nasal spray Place 1 spray into both nostrils daily. 16 g 0     methocarbamol (ROBAXIN) 500 MG tablet Take 1 tablet (500 mg total) by mouth 2 (two) times daily. 20 tablet 0     naproxen (NAPROSYN) 500 MG tablet Take 1 tablet (500 mg total) by mouth 2 (two) times daily. 30 tablet 0     ondansetron (ZOFRAN ODT) 4 MG disintegrating tablet Take 1 tablet (4 mg total) by mouth every 8 (eight) hours as needed for nausea. 10 tablet 0            I have reviewed patient's Past Medical Hx, Surgical Hx, Family Hx, Social Hx, medications and allergies.    ROS  Pertinent items noted in HPI and remainder of comprehensive ROS otherwise negative.    PHYSICAL EXAM  Patient Vitals for the past 24 hrs:   BP Temp Temp src Pulse Resp SpO2 Height Weight  08/03/23 1715 127/79 -- -- 82 -- 97 % -- --  08/03/23 1700 137/82 -- -- 80 -- 97 % -- --  08/03/23 1645 121/66 -- -- 89 -- 97 % -- --  08/03/23 1630 (!) 141/84 -- -- 80 -- 98 % -- --  08/03/23 1625 (!) 149/99 97.8 F (36.6 C) Oral 80 18 99 % -- --  08/03/23 1401 (!) 140/86 -- -- 69 -- -- -- --  08/03/23 1347 133/74 -- -- 74 -- -- -- --  08/03/23 1330 (!) 138/90 -- -- 74 -- 96 % -- --  08/03/23 1300 135/85 -- -- 68 -- 97 % -- --  08/03/23 1250 -- -- -- -- -- 93 % -- --  08/03/23 1230 137/84 -- -- 78 -- 97 % -- --  08/03/23 1200 132/82 -- -- 74 -- 98 % -- --  08/03/23 1146 133/85 -- -- 72 -- -- -- --   08/03/23 1140 136/87 -- -- 74 -- 98 % -- --  08/03/23 1100 (!) 148/100 -- -- 75 -- 97 % -- --  08/03/23 1045 133/85 -- -- 77 -- 98 % -- --  08/03/23 1030 130/85 -- -- 80 -- 98 % -- --  08/03/23 1015 (!) 115/92 -- -- 79 -- 98 % -- --  08/03/23 1000 129/81 -- -- 83 -- 96 % -- --  08/03/23 0945 122/79 -- -- 81 -- 97 % -- --  08/03/23 0935 (!) 149/95 97.6 F (36.4 C) Oral 84 18 97 % 5\' 6"  (1.676 m) --  08/03/23 0916 -- -- -- -- -- -- -- 114.4 kg      Constitutional: Well-developed, obese female appears in distress  Cardiovascular: normal rate & rhythm, warm and well-perfused Respiratory: labored breathing , Lungs BCTA , patient having difficulty taking a deep breath during examination GI: Abd soft, appropriately tender to touch, Incision is C/D/I with a small midline suture exposed  MS: Lower extremities are tender B.L with pedal edema 3+ , Negative Clonus and B/L Normo reflexic Neurologic: Alert and oriented x 4.  GU: no CVA tenderness Pelvic: Deferred                  Copied from PP discharge Note  2/5 C- Section with BTL, PP Anemia hemoglobin of 8.7 down from 11.4 on POD#1 no treatment as  patient was asymptomatic per chart review      Labs: Lab Results Last 24 Hours       Results for orders placed or performed during the hospital encounter of 08/03/23 (from the past 24 hours)  CBC     Status: Abnormal    Collection Time: 08/03/23 11:53 AM  Result Value Ref Range    WBC 7.9 4.0 - 10.5 K/uL    RBC 3.34 (L) 3.87 - 5.11 MIL/uL    Hemoglobin 8.8 (L) 12.0 - 15.0 g/dL    HCT 19.1 (L) 47.8 - 46.0 %    MCV 87.4 80.0 - 100.0 fL    MCH 26.3 26.0 - 34.0 pg    MCHC 30.1 30.0 - 36.0 g/dL    RDW 29.5 (H) 62.1 - 15.5 %    Platelets 377 150 - 400 K/uL    nRBC 0.0 0.0 - 0.2 %  Comprehensive metabolic panel     Status: Abnormal    Collection Time: 08/03/23 11:53 AM  Result Value Ref Range    Sodium 141 135 - 145 mmol/L    Potassium 4.1 3.5 - 5.1 mmol/L    Chloride 109 98 - 111 mmol/L     CO2 24 22 - 32 mmol/L    Glucose, Bld 86 70 - 99 mg/dL    BUN 14 6 - 20 mg/dL    Creatinine, Ser 3.08 0.44 - 1.00 mg/dL    Calcium 8.7 (L) 8.9 - 10.3 mg/dL    Total Protein 5.6 (L) 6.5 - 8.1 g/dL    Albumin 2.4 (L) 3.5 - 5.0 g/dL    AST 31 15 - 41 U/L    ALT 20 0 - 44 U/L    Alkaline Phosphatase 53 38 - 126 U/L    Total Bilirubin 0.4 0.0 - 1.2 mg/dL    GFR, Estimated >65 >78 mL/min    Anion gap 8 5 - 15        Imaging:  CT Angio Chest Pulmonary Embolism (PE) W or WO Contrast Result Date: 08/03/2023 CLINICAL DATA:  Shortness of breath for the past 2 days, worse when lying down or with activity. Recent C-section 9 days ago. EXAM: CT ANGIOGRAPHY CHEST WITH CONTRAST TECHNIQUE: Multidetector CT imaging of the chest was performed using the standard protocol during bolus administration of intravenous contrast. Multiplanar CT image reconstructions and MIPs were obtained to evaluate the vascular anatomy. RADIATION DOSE REDUCTION: This exam was performed according to the departmental dose-optimization program which includes automated exposure control, adjustment of the mA and/or kV according to patient size and/or use of iterative reconstruction technique. CONTRAST:  65mL OMNIPAQUE IOHEXOL 350 MG/ML SOLN COMPARISON:  Chest x-ray dated July 24, 2020. FINDINGS: Cardiovascular: Satisfactory opacification of the pulmonary arteries to the segmental level. No evidence of pulmonary embolism. Borderline cardiomegaly. No pericardial effusion. No thoracic aortic aneurysm or dissection. Mediastinum/Nodes: No enlarged mediastinal, hilar, or axillary lymph nodes. Thyroid gland, trachea, and esophagus demonstrate no significant findings. Lungs/Pleura: Moderate right-greater-than-left pleural effusions. Upper lobe and perihilar predominant peribronchovascular ground-glass densities with scattered smooth interlobular septal thickening. Subsegmental atelectasis in both lower lobes. No pneumothorax. Upper Abdomen: No  acute abnormality. Musculoskeletal: No chest wall abnormality. No acute or significant osseous findings. Chronic mild T4 superior endplate compression deformity. Review of the MIP images confirms the above findings. IMPRESSION: 1. No evidence of pulmonary embolism. 2. Moderate right-greater-than-left pleural effusions with mild pulmonary edema. Electronically Signed   By: Obie Dredge M.D.   On: 08/03/2023 16:39  MDM & MAU COURSE  MDM:   HIGH   @ 1300 Patient  reassessed and she reports pain level overall is 7/10 on pain score. Offered additional pain meds and she declined at this time reporting the pain is now more in her lower feet and extremities.  Incisional pain has resolved. At patient request I removed the midline suture with bandage scissor from suture removal kit  that was visually apparent on initial exam. Patient tolerated this well.           Today's Vitals    08/03/23 1630 08/03/23 1645 08/03/23 1700 08/03/23 1715  BP: (!) 141/84 121/66 137/82 127/79  Pulse: 80 89 80 82  Resp:          Temp:          TempSrc:          SpO2: 98% 97% 97% 97%  Weight:          Height:          PainSc:            Body mass index is 40.72 kg/m.    BP's are MRBP's with lower extremity edema c/w gHTN PP  Awaiting CT-PA  Plan for likely discharge with Procardia and Lasix for PP hypertension (gHTN) pending results   @ 1718 Results from CT- PA c/w Pulmonary Edema ( Will discuss further management with Dr Charlotta Newton _ OB Attending)   IMPRESSION: 1. No evidence of pulmonary embolism. 2. Moderate right-greater-than-left pleural effusions with mild pulmonary edema.   MAU Course:    Orders Placed This Encounter  Procedures   CT Angio Chest Pulmonary Embolism (PE) W or WO Contrast   CBC   Comprehensive metabolic panel   Maintain IV access   Notify physician (specify) Confirmatory reading of BP> 160/110 15 minutes later   Apply Hypertensive Disorders of Pregnancy Care Plan   Measure blood  pressure   EKG 12-Lead   ECHOCARDIOGRAM COMPLETE        Meds ordered this encounter  Medications   HYDROmorphone (DILAUDID) injection 1 mg      Refill:  0   DISCONTD: NIFEdipine (PROCARDIA) capsule 10 mg   DISCONTD: NIFEdipine (PROCARDIA) capsule 20 mg   DISCONTD: NIFEdipine (PROCARDIA) capsule 20 mg   DISCONTD: labetalol (NORMODYNE) injection 40 mg   iohexol (OMNIPAQUE) 350 MG/ML injection 65 mL   DISCONTD: furosemide (LASIX) tablet 20 mg   NIFEdipine (PROCARDIA-XL/NIFEDICAL-XL) 24 hr tablet 30 mg   furosemide (LASIX) injection 20 mg      ASSESSMENT    1. SOB (shortness of breath)   2. Postpartum hypertension   3. Status post cesarean section   4. Pedal edema   5. Incisional pain   6. Acute pulmonary edema (HCC)       PLAN     Admit to [facility] Speciality care as DW Dr Charlotta Newton  - Orders to follow - Orders placed for CXR and Echo by me - Procardia 30 XL and Lasix 20 mg PO Stat given in MAU prior to transfer  - Patient counseled regarding admission and management plan and is agreeable to our plan as dw Dr Maryan Char ( OB Attending)  Marcell Barlow, MSN, [facility], Center for [facility]

## 2023-08-03 NOTE — MAU Provider Note (Signed)
 Chief Complaint:  Shortness of Breath and Swelling   HPI  POSTPARTUM  Sheri Stout is a 38 y.o. [phone] at Unknown who presents to maternity admissions reporting she is postpartum s/p Emergent C- Section at 36.4 weeks on 07/25/23 [facility]  for Non reassuring antenatal testing with a BPP 4/8. Patient reports her pregnancy was complicated by GDM and gHTN. She reports she was only taking Metformin and no medications for hypertension. She denies receiving any antihypertensives while in labor or postpartum. Has only taken Ibuprofen for incisional pain. She noticed after discharge on 07/28/23 she started to develop PP swelling in her lower extremities and having incisional pain in her abdomen. Recently her symptoms have progressed to SOB x x 2 days at rest. She is unable to perform normal ADL's without feeling SOB. . She rates her overall pain as 9/10 on pain scale which includes PP swelling, SOB, and incisional pain.   Pregnancy Course: [employer] - [place]  Past Medical History:  Diagnosis Date   Gestational diabetes    Medical history non-contributory    OB History  Gravida Para Term Preterm AB Living  6 5 4 1 1 5   SAB IAB Ectopic Multiple Live Births   1   5    # Outcome Date GA Lbr Len/2nd Weight Sex Type Anes PTL Lv  6 IAB           5 Preterm           4 Term           3 Term           2 Term           1 Term            Past Surgical History:  Procedure Laterality Date   CESAREAN SECTION     Family History  Problem Relation Age of Onset   Kidney failure Father    Social History   Tobacco Use   Smoking status: Never   Smokeless tobacco: Never  Vaping Use   Vaping status: Never Used  Substance Use Topics   Alcohol use: Yes    Comment: Social   Drug use: Never   No Known Allergies Medications Prior to Admission  Medication Sig Dispense Refill Last Dose/Taking   HYDROcodone-acetaminophen (NORCO/VICODIN) 5-325 MG tablet Take 1 tablet by mouth every 6 (six) hours  as needed for severe pain. 10 tablet 0 08/02/2023   ibuprofen (ADVIL) 800 MG tablet Take 800 mg by mouth every 8 (eight) hours as needed.   08/02/2023   fluticasone (FLONASE) 50 MCG/ACT nasal spray Place 1 spray into both nostrils daily. 16 g 0    methocarbamol (ROBAXIN) 500 MG tablet Take 1 tablet (500 mg total) by mouth 2 (two) times daily. 20 tablet 0    naproxen (NAPROSYN) 500 MG tablet Take 1 tablet (500 mg total) by mouth 2 (two) times daily. 30 tablet 0    ondansetron (ZOFRAN ODT) 4 MG disintegrating tablet Take 1 tablet (4 mg total) by mouth every 8 (eight) hours as needed for nausea. 10 tablet 0     I have reviewed patient's Past Medical Hx, Surgical Hx, Family Hx, Social Hx, medications and allergies.   ROS  Pertinent items noted in HPI and remainder of comprehensive ROS otherwise negative.   PHYSICAL EXAM  Patient Vitals for the past 24 hrs:  BP Temp Temp src Pulse Resp SpO2 Height Weight  08/03/23 1715 127/79 -- -- 82 --  97 % -- --  08/03/23 1700 137/82 -- -- 80 -- 97 % -- --  08/03/23 1645 121/66 -- -- 89 -- 97 % -- --  08/03/23 1630 (!) 141/84 -- -- 80 -- 98 % -- --  08/03/23 1625 (!) 149/99 97.8 F (36.6 C) Oral 80 18 99 % -- --  08/03/23 1401 (!) 140/86 -- -- 69 -- -- -- --  08/03/23 1347 133/74 -- -- 74 -- -- -- --  08/03/23 1330 (!) 138/90 -- -- 74 -- 96 % -- --  08/03/23 1300 135/85 -- -- 68 -- 97 % -- --  08/03/23 1250 -- -- -- -- -- 93 % -- --  08/03/23 1230 137/84 -- -- 78 -- 97 % -- --  08/03/23 1200 132/82 -- -- 74 -- 98 % -- --  08/03/23 1146 133/85 -- -- 72 -- -- -- --  08/03/23 1140 136/87 -- -- 74 -- 98 % -- --  08/03/23 1100 (!) 148/100 -- -- 75 -- 97 % -- --  08/03/23 1045 133/85 -- -- 77 -- 98 % -- --  08/03/23 1030 130/85 -- -- 80 -- 98 % -- --  08/03/23 1015 (!) 115/92 -- -- 79 -- 98 % -- --  08/03/23 1000 129/81 -- -- 83 -- 96 % -- --  08/03/23 0945 122/79 -- -- 81 -- 97 % -- --  08/03/23 0935 (!) 149/95 97.6 F (36.4 C) Oral 84 18 97 % 5\' 6"   (1.676 m) --  08/03/23 0916 -- -- -- -- -- -- -- 114.4 kg    Constitutional: Well-developed, obese female appears in distress  Cardiovascular: normal rate & rhythm, warm and well-perfused Respiratory: labored breathing , Lungs BCTA , patient having difficulty taking a deep breath during examination GI: Abd soft, appropriately tender to touch, Incision is C/D/I with a small midline suture exposed  MS: Lower extremities are tender B.L with pedal edema 3+ , Negative Clonus and B/L Normo reflexic Neurologic: Alert and oriented x 4.  GU: no CVA tenderness Pelvic: Deferred      Copied from PP discharge Note  2/5 C- Section with BTL, PP Anemia hemoglobin of 8.7 down from 11.4 on POD#1 no treatment as patient was asymptomatic per chart review    Labs: Results for orders placed or performed during the hospital encounter of 08/03/23 (from the past 24 hours)  CBC     Status: Abnormal   Collection Time: 08/03/23 11:53 AM  Result Value Ref Range   WBC 7.9 4.0 - 10.5 K/uL   RBC 3.34 (L) 3.87 - 5.11 MIL/uL   Hemoglobin 8.8 (L) 12.0 - 15.0 g/dL   HCT 16.1 (L) 09.6 - 04.5 %   MCV 87.4 80.0 - 100.0 fL   MCH 26.3 26.0 - 34.0 pg   MCHC 30.1 30.0 - 36.0 g/dL   RDW 40.9 (H) 81.1 - 91.4 %   Platelets 377 150 - 400 K/uL   nRBC 0.0 0.0 - 0.2 %  Comprehensive metabolic panel     Status: Abnormal   Collection Time: 08/03/23 11:53 AM  Result Value Ref Range   Sodium 141 135 - 145 mmol/L   Potassium 4.1 3.5 - 5.1 mmol/L   Chloride 109 98 - 111 mmol/L   CO2 24 22 - 32 mmol/L   Glucose, Bld 86 70 - 99 mg/dL   BUN 14 6 - 20 mg/dL   Creatinine, Ser 7.82 0.44 - 1.00 mg/dL   Calcium 8.7 (L) 8.9 - 10.3  mg/dL   Total Protein 5.6 (L) 6.5 - 8.1 g/dL   Albumin 2.4 (L) 3.5 - 5.0 g/dL   AST 31 15 - 41 U/L   ALT 20 0 - 44 U/L   Alkaline Phosphatase 53 38 - 126 U/L   Total Bilirubin 0.4 0.0 - 1.2 mg/dL   GFR, Estimated >16 >10 mL/min   Anion gap 8 5 - 15    Imaging:  CT Angio Chest Pulmonary Embolism (PE)  W or WO Contrast Result Date: 08/03/2023 CLINICAL DATA:  Shortness of breath for the past 2 days, worse when lying down or with activity. Recent C-section 9 days ago. EXAM: CT ANGIOGRAPHY CHEST WITH CONTRAST TECHNIQUE: Multidetector CT imaging of the chest was performed using the standard protocol during bolus administration of intravenous contrast. Multiplanar CT image reconstructions and MIPs were obtained to evaluate the vascular anatomy. RADIATION DOSE REDUCTION: This exam was performed according to the departmental dose-optimization program which includes automated exposure control, adjustment of the mA and/or kV according to patient size and/or use of iterative reconstruction technique. CONTRAST:  65mL OMNIPAQUE IOHEXOL 350 MG/ML SOLN COMPARISON:  Chest x-ray dated July 24, 2020. FINDINGS: Cardiovascular: Satisfactory opacification of the pulmonary arteries to the segmental level. No evidence of pulmonary embolism. Borderline cardiomegaly. No pericardial effusion. No thoracic aortic aneurysm or dissection. Mediastinum/Nodes: No enlarged mediastinal, hilar, or axillary lymph nodes. Thyroid gland, trachea, and esophagus demonstrate no significant findings. Lungs/Pleura: Moderate right-greater-than-left pleural effusions. Upper lobe and perihilar predominant peribronchovascular ground-glass densities with scattered smooth interlobular septal thickening. Subsegmental atelectasis in both lower lobes. No pneumothorax. Upper Abdomen: No acute abnormality. Musculoskeletal: No chest wall abnormality. No acute or significant osseous findings. Chronic mild T4 superior endplate compression deformity. Review of the MIP images confirms the above findings. IMPRESSION: 1. No evidence of pulmonary embolism. 2. Moderate right-greater-than-left pleural effusions with mild pulmonary edema. Electronically Signed   By: Obie Dredge M.D.   On: 08/03/2023 16:39    MDM & MAU COURSE  MDM:  HIGH  @ 1300 Patient   reassessed and she reports pain level overall is 7/10 on pain score. Offered additional pain meds and she declined at this time reporting the pain is now more in her lower feet and extremities.  Incisional pain has resolved. At patient request I removed the midline suture with bandage scissor from suture removal kit  that was visually apparent on initial exam. Patient tolerated this well.    Today's Vitals   08/03/23 1630 08/03/23 1645 08/03/23 1700 08/03/23 1715  BP: (!) 141/84 121/66 137/82 127/79  Pulse: 80 89 80 82  Resp:      Temp:      TempSrc:      SpO2: 98% 97% 97% 97%  Weight:      Height:      PainSc:       Body mass index is 40.72 kg/m.   BP's are MRBP's with lower extremity edema c/w gHTN PP  Awaiting CT-PA  Plan for likely discharge with Procardia and Lasix for PP hypertension (gHTN) pending results  @ 1718 Results from CT- PA c/w Pulmonary Edema ( Will discuss further management with Dr Charlotta Newton _ OB Attending)  IMPRESSION: 1. No evidence of pulmonary embolism. 2. Moderate right-greater-than-left pleural effusions with mild pulmonary edema.  MAU Course: Orders Placed This Encounter  Procedures   CT Angio Chest Pulmonary Embolism (PE) W or WO Contrast   CBC   Comprehensive metabolic panel   Maintain IV access   Notify  physician (specify) Confirmatory reading of BP> 160/110 15 minutes later   Apply Hypertensive Disorders of Pregnancy Care Plan   Measure blood pressure   EKG 12-Lead   ECHOCARDIOGRAM COMPLETE   Meds ordered this encounter  Medications   HYDROmorphone (DILAUDID) injection 1 mg    Refill:  0   DISCONTD: NIFEdipine (PROCARDIA) capsule 10 mg   DISCONTD: NIFEdipine (PROCARDIA) capsule 20 mg   DISCONTD: NIFEdipine (PROCARDIA) capsule 20 mg   DISCONTD: labetalol (NORMODYNE) injection 40 mg   iohexol (OMNIPAQUE) 350 MG/ML injection 65 mL   DISCONTD: furosemide (LASIX) tablet 20 mg   NIFEdipine (PROCARDIA-XL/NIFEDICAL-XL) 24 hr tablet 30 mg    furosemide (LASIX) injection 20 mg    ASSESSMENT   1. SOB (shortness of breath)   2. Postpartum hypertension   3. Status post cesarean section   4. Pedal edema   5. Incisional pain   6. Acute pulmonary edema (HCC)     PLAN    Admit to [facility] Speciality care as DW Dr Charlotta Newton  - Orders to follow - Orders placed for CXR and Echo by me - Procardia 30 XL and Lasix 20 mg PO Stat given in MAU prior to transfer  - Patient counseled regarding admission and management plan and is agreeable to our plan as dw Dr Maryan Char ( OB Attending)  Marcell Barlow, MSN, [facility], Center for [facility]

## 2023-08-04 ENCOUNTER — Inpatient Hospital Stay (HOSPITAL_COMMUNITY): Payer: Medicaid Other

## 2023-08-04 DIAGNOSIS — J810 Acute pulmonary edema: Secondary | ICD-10-CM

## 2023-08-04 DIAGNOSIS — I517 Cardiomegaly: Secondary | ICD-10-CM | POA: Diagnosis not present

## 2023-08-04 LAB — RESP PANEL BY RT-PCR (RSV, FLU A&B, COVID)  RVPGX2
Influenza A by PCR: POSITIVE — AB
Influenza B by PCR: NEGATIVE
Resp Syncytial Virus by PCR: NEGATIVE
SARS Coronavirus 2 by RT PCR: NEGATIVE

## 2023-08-04 LAB — ECHOCARDIOGRAM COMPLETE
Area-P 1/2: 4.01 cm2
Height: 66 in
S' Lateral: 2.6 cm
Weight: 4036.8 [oz_av]

## 2023-08-04 LAB — BRAIN NATRIURETIC PEPTIDE: B Natriuretic Peptide: 334 pg/mL — ABNORMAL HIGH (ref 0.0–100.0)

## 2023-08-04 MED ORDER — AMLODIPINE BESYLATE 5 MG PO TABS: 5.0000 mg | ORAL_TABLET | Freq: Every day | ORAL | 0 refills | Status: AC

## 2023-08-04 MED ORDER — ACETAMINOPHEN 325 MG PO TABS: 650.0000 mg | ORAL_TABLET | Freq: Four times a day (QID) | ORAL | Status: AC | PRN

## 2023-08-04 MED ORDER — FLUTICASONE PROPIONATE 50 MCG/ACT NA SUSP
2.0000 | Freq: Every day | NASAL | Status: DC
  Administered 2023-08-04 – 2023-08-05 (×2): 2 via NASAL
  Filled 2023-08-04: qty 16

## 2023-08-04 MED ORDER — OSELTAMIVIR PHOSPHATE 75 MG PO CAPS: 75.0000 mg | ORAL_CAPSULE | Freq: Two times a day (BID) | ORAL | 0 refills | Status: AC

## 2023-08-04 MED ORDER — OSELTAMIVIR PHOSPHATE 75 MG PO CAPS
75.0000 mg | ORAL_CAPSULE | Freq: Two times a day (BID) | ORAL | Status: DC
  Administered 2023-08-04 – 2023-08-05 (×2): 75 mg via ORAL
  Filled 2023-08-04 (×3): qty 1

## 2023-08-04 MED ORDER — FUROSEMIDE 20 MG PO TABS: 20.0000 mg | ORAL_TABLET | Freq: Two times a day (BID) | ORAL | 0 refills | Status: DC

## 2023-08-04 MED ORDER — MENTHOL 3 MG MT LOZG
1.0000 | LOZENGE | OROMUCOSAL | Status: DC | PRN
  Filled 2023-08-04: qty 9

## 2023-08-04 MED ORDER — AMLODIPINE BESYLATE 5 MG PO TABS
5.0000 mg | ORAL_TABLET | Freq: Every day | ORAL | Status: DC
  Administered 2023-08-05: 5 mg via ORAL
  Filled 2023-08-04: qty 1

## 2023-08-04 MED ORDER — CYCLOBENZAPRINE HCL 10 MG PO TABS
10.0000 mg | ORAL_TABLET | Freq: Three times a day (TID) | ORAL | Status: DC | PRN
  Administered 2023-08-04 (×2): 10 mg via ORAL
  Filled 2023-08-04 (×2): qty 1

## 2023-08-04 NOTE — Progress Notes (Signed)
 At bedside to discuss results with patient.  +Flu- started on Tamiflu Reviewed conservative treatment Plan to discharge home in am  Myna Hidalgo, DO Attending Obstetrician & Gynecologist, [facility] for [employer], [facility]

## 2023-08-04 NOTE — Progress Notes (Signed)
 Echocardiogram 2D Echocardiogram has been performed.  Warren Lacy Birdena Kingma RDCS 08/04/2023, 9:36 AM

## 2023-08-04 NOTE — Progress Notes (Signed)
 Pt c/o worsening sore throat and congestion. Dr. Macon Large made aware and orders were put in for flu/covid test.

## 2023-08-04 NOTE — Progress Notes (Signed)
 FACULTY PRACTICE ANTEPARTUM COMPREHENSIVE PROGRESS NOTE  Sheri Stout is a 38 y.o. [id number] who is POD10 s/p 1LTCS/BTS d/t NRFHT and BPP 4/8 at [redacted]w[redacted]d in setting of GDM, who is admitted for pulmonary edema with new onset mild range blood pressures c/w PP GHTN.    Length of Stay:  1 Days. Admitted 08/03/2023  Subjective: She reports mild headache today. Has not taken anything for it yet. She reports SOB is improved.   Vitals:  Blood pressure 126/82, pulse 90, temperature 98.4 F (36.9 C), temperature source Oral, resp. rate 19, height 5\' 6"  (1.676 m), weight 114.4 kg, SpO2 95%. Physical Examination: CONSTITUTIONAL: Well-developed, well-nourished female in no acute distress.  NEUROLOGIC: Alert and oriented to person, place, and time. No cranial nerve deficit noted. PSYCHIATRIC: Normal mood and affect. Normal behavior. Normal judgment and thought content. CARDIOVASCULAR: Normal heart rate noted RESPIRATORY: Effort  normal, no problems with respiration noted MUSCULOSKELETAL: Normal range of motion. No edema and no tenderness. 2+ distal pulses. ABDOMEN: Soft, nontender, nondistended   Results for orders placed or performed during the hospital encounter of 08/03/23 (from the past 48 hours)  CBC     Status: Abnormal   Collection Time: 08/03/23 11:53 AM  Result Value Ref Range   WBC 7.9 4.0 - 10.5 K/uL   RBC 3.34 (L) 3.87 - 5.11 MIL/uL   Hemoglobin 8.8 (L) 12.0 - 15.0 g/dL   HCT 69.6 (L) 29.5 - 28.4 %   MCV 87.4 80.0 - 100.0 fL   MCH 26.3 26.0 - 34.0 pg   MCHC 30.1 30.0 - 36.0 g/dL   RDW 13.2 (H) 44.0 - 10.2 %   Platelets 377 150 - 400 K/uL   nRBC 0.0 0.0 - 0.2 %    Comment: Performed at [facility], [address] [place], [place] [address]  Comprehensive metabolic panel     Status: Abnormal   Collection Time: 08/03/23 11:53 AM  Result Value Ref Range   Sodium 141 135 - 145 mmol/L   Potassium 4.1 3.5 - 5.1 mmol/L   Chloride 109 98 - 111 mmol/L   CO2 24 22 - 32 mmol/L    Glucose, Bld 86 70 - 99 mg/dL    Comment: Glucose reference range applies only to samples taken after fasting for at least 8 hours.   BUN 14 6 - 20 mg/dL   Creatinine, Ser 6.44 0.44 - 1.00 mg/dL   Calcium 8.7 (L) 8.9 - 10.3 mg/dL   Total Protein 5.6 (L) 6.5 - 8.1 g/dL   Albumin 2.4 (L) 3.5 - 5.0 g/dL   AST 31 15 - 41 U/L   ALT 20 0 - 44 U/L   Alkaline Phosphatase 53 38 - 126 U/L   Total Bilirubin 0.4 0.0 - 1.2 mg/dL   GFR, Estimated >03 >47 mL/min    Comment: (NOTE) Calculated using the CKD-EPI Creatinine Equation (2021)    Anion gap 8 5 - 15    Comment: Performed at [facility], [address] [place], [place] [address]  Brain natriuretic peptide     Status: Abnormal   Collection Time: 08/04/23  5:08 AM  Result Value Ref Range   B Natriuretic Peptide 334.0 (H) 0.0 - 100.0 pg/mL    Comment: Performed at [facility], [address], [place], [place] [address]    DG Chest Port 1 View Result Date: 08/03/2023 CLINICAL DATA:  Pulmonary edema.  Shortness of breath EXAM: PORTABLE CHEST 1 VIEW COMPARISON:  07/24/2020  and CTA chest 08/03/2023 FINDINGS: Hazy obscuration of the lung bases compatible layering pleural effusions shown on CT scan. Associated passive atelectasis. The patchy ground-glass opacities shown in the lungs on the CT scan from earlier today are less apparent on today's chest radiograph, query improving edema. Borderline enlargement of the cardiopericardial silhouette. IMPRESSION: 1. Layering pleural effusions with associated passive atelectasis. 2. The patchy ground-glass opacities shown in the lungs on the CT scan from earlier today are less apparent on today's chest radiograph, query improving edema. 3. Borderline enlargement of the cardiopericardial silhouette. Electronically Signed   By: Gaylyn Rong M.D.   On: 08/03/2023 20:01   CT Angio Chest Pulmonary Embolism (PE) W or WO Contrast Result Date: 08/03/2023 CLINICAL DATA:  Shortness of breath for the  past 2 days, worse when lying down or with activity. Recent C-section 9 days ago. EXAM: CT ANGIOGRAPHY CHEST WITH CONTRAST TECHNIQUE: Multidetector CT imaging of the chest was performed using the standard protocol during bolus administration of intravenous contrast. Multiplanar CT image reconstructions and MIPs were obtained to evaluate the vascular anatomy. RADIATION DOSE REDUCTION: This exam was performed according to the departmental dose-optimization program which includes automated exposure control, adjustment of the mA and/or kV according to patient size and/or use of iterative reconstruction technique. CONTRAST:  65mL OMNIPAQUE IOHEXOL 350 MG/ML SOLN COMPARISON:  Chest x-ray dated July 24, 2020. FINDINGS: Cardiovascular: Satisfactory opacification of the pulmonary arteries to the segmental level. No evidence of pulmonary embolism. Borderline cardiomegaly. No pericardial effusion. No thoracic aortic aneurysm or dissection. Mediastinum/Nodes: No enlarged mediastinal, hilar, or axillary lymph nodes. Thyroid gland, trachea, and esophagus demonstrate no significant findings. Lungs/Pleura: Moderate right-greater-than-left pleural effusions. Upper lobe and perihilar predominant peribronchovascular ground-glass densities with scattered smooth interlobular septal thickening. Subsegmental atelectasis in both lower lobes. No pneumothorax. Upper Abdomen: No acute abnormality. Musculoskeletal: No chest wall abnormality. No acute or significant osseous findings. Chronic mild T4 superior endplate compression deformity. Review of the MIP images confirms the above findings. IMPRESSION: 1. No evidence of pulmonary embolism. 2. Moderate right-greater-than-left pleural effusions with mild pulmonary edema. Electronically Signed   By: Obie Dredge M.D.   On: 08/03/2023 16:39    Current scheduled medications  enoxaparin (LOVENOX) injection  60 mg Subcutaneous Q24H   ferrous sulfate  325 mg Oral QODAY   furosemide  20 mg  Oral BID   ibuprofen  600 mg Oral Q6H   NIFEdipine  30 mg Oral Daily   prenatal multivitamin  1 tablet Oral Q1200   senna-docusate  2 tablet Oral Q24H   Tdap  0.5 mL Intramuscular Once    I have reviewed the patient's current medications.  ASSESSMENT: Principal Problem:   Pulmonary edema   PLAN: - Echo report pending but completed. CTA negative for PE - Continue Lasix - Procardia for mild range bps. HA source is likely the procardia since started after medication initiated.  - baseline K is 4.1. Remainder of CMP is wnl.  - Postop hgb is c/w anemia - She was noted in her admission from 2/5 to have acute blood loss anemia from her surgery. Her nadir following surgery during that admission as 8.3. It is 8.8. yesterday, will continue oral iron.      Milas Hock, MD, FACOG Obstetrician & Gynecologist, [facility] for [facility], [facility]

## 2023-08-05 DIAGNOSIS — J811 Chronic pulmonary edema: Secondary | ICD-10-CM

## 2023-08-05 DIAGNOSIS — J101 Influenza due to other identified influenza virus with other respiratory manifestations: Secondary | ICD-10-CM

## 2023-08-05 MED ORDER — FUROSEMIDE 20 MG PO TABS: 20.0000 mg | ORAL_TABLET | Freq: Every day | ORAL | 0 refills | Status: AC

## 2023-08-05 NOTE — Discharge Summary (Signed)
 Physician Discharge Summary  Patient ID: Sheri Stout MRN: [id number] DOB/AGE: 12/15/1985 37 y.o.  Admit date: 08/03/2023 Discharge date: 08/05/2023  Admission Diagnoses: Hypervolemia, pulmonary edema  Discharge Diagnoses:  Principal Problem:   Pulmonary edema Influenza A  Discharged Condition: stable  Hospital Course: 95GL [id number] s/p primary C-section/Bilateral salpingectomy performed at outside facility who presented with SOB.  She was diagnosed with pulmonary edema.  She was treated with both IV and oral Lasix.  Echo showed normal cardiac function.  No evidence of cardiomyopathy per chart review patient had worsening gestational hypertension, lab work remained stable.  She was treated with oral antihypertensives.  On hospital day #1 patient also reported cough and sore throat.  Respiratory panel revealed influenza A.  She was started on Tamiflu twice daily.  Her symptoms significantly improved and she was discharged home in stable condition  Consults: None  Significant Diagnostic Studies:  CT-1. No evidence of pulmonary embolism. 2. Moderate right-greater-than-left pleural effusions with mild pulmonary edema.  ECHO: Left Ventricle: Left ventricular ejection fraction, by estimation, is 65  to 70%. The left ventricle has normal function. The left ventricle has no  regional wall motion abnormalities. Strain imaging was not performed.   Resp panel: Influenza A     Treatments: Lasix, Procardia, Tamiflu  Discharge Exam: Blood pressure (!) 149/90, pulse 81, temperature 97.6 F (36.4 C), temperature source Oral, resp. rate 18, height 5\' 6"  (1.676 m), weight 114.4 kg, SpO2 97%. General appearance: alert, cooperative, and no distress Lungs: normal respiratory effort, CTAB Cardio: regular rate and rhythm GI: soft, non-tender.  Incision well healed- C/D/I Extremities: no edema, no calf tenderness bilaterally Skin: warm to touch, not diaphoretic  Disposition:    Allergies as  of 08/05/2023   No Known Allergies      Medication List     TAKE these medications    acetaminophen 325 MG tablet Commonly known as: Tylenol Take 2 tablets (650 mg total) by mouth every 6 (six) hours as needed (for pain scale < 4).   amLODipine 5 MG tablet Commonly known as: NORVASC Take 1 tablet (5 mg total) by mouth daily.   fluticasone 50 MCG/ACT nasal spray Commonly known as: FLONASE Place 1 spray into both nostrils daily.   furosemide 20 MG tablet Commonly known as: LASIX Take 1 tablet (20 mg total) by mouth daily for 5 days.   HYDROcodone-acetaminophen 5-325 MG tablet Commonly known as: NORCO/VICODIN Take 1 tablet by mouth every 6 (six) hours as needed for severe pain.   ibuprofen 800 MG tablet Commonly known as: ADVIL Take 800 mg by mouth every 8 (eight) hours as needed.   methocarbamol 500 MG tablet Commonly known as: ROBAXIN Take 1 tablet (500 mg total) by mouth 2 (two) times daily.   naproxen 500 MG tablet Commonly known as: NAPROSYN Take 1 tablet (500 mg total) by mouth 2 (two) times daily.   ondansetron 4 MG disintegrating tablet Commonly known as: Zofran ODT Take 1 tablet (4 mg total) by mouth every 8 (eight) hours as needed for nausea.   oseltamivir 75 MG capsule Commonly known as: TAMIFLU Take 1 capsule (75 mg total) by mouth 2 (two) times daily for 5 days.        Follow-up Information     [facility] Follow up.   Specialty: Obstetrics and Gynecology Why: Please make a follow up appointment in one week Contact information: [address] [place] [address] [phone]  Signed: Sharon Seller 08/05/2023, 7:22 AM

## 2023-08-09 ENCOUNTER — Telehealth: Payer: Self-pay

## 2023-08-09 NOTE — Telephone Encounter (Signed)
 Pt called stating that she delivered on Feb 5 with complications and she has questions about her blood pressure.  Message routed to [facility] as pt is scheduled for pp visit on 08/13/23.   Leonette Nutting  08/09/23

## 2023-08-10 ENCOUNTER — Telehealth (HOSPITAL_BASED_OUTPATIENT_CLINIC_OR_DEPARTMENT_OTHER): Payer: Self-pay | Admitting: Obstetrics & Gynecology

## 2023-08-10 NOTE — Telephone Encounter (Signed)
 Called pt via phone number on chart.  No DPR on chart.  Asked pt to called back.  Number provider to nursing line at [facility] location.

## 2023-08-13 ENCOUNTER — Other Ambulatory Visit (HOSPITAL_BASED_OUTPATIENT_CLINIC_OR_DEPARTMENT_OTHER): Payer: Self-pay

## 2023-08-13 ENCOUNTER — Encounter (HOSPITAL_BASED_OUTPATIENT_CLINIC_OR_DEPARTMENT_OTHER): Payer: Self-pay | Admitting: Certified Nurse Midwife

## 2023-08-13 ENCOUNTER — Ambulatory Visit (INDEPENDENT_AMBULATORY_CARE_PROVIDER_SITE_OTHER): Payer: Medicaid Other | Admitting: Certified Nurse Midwife

## 2023-08-13 ENCOUNTER — Other Ambulatory Visit: Payer: Self-pay

## 2023-08-13 MED ORDER — HYDROCHLOROTHIAZIDE 25 MG PO TABS
25.0000 mg | ORAL_TABLET | Freq: Every day | ORAL | 0 refills | Status: DC
  Filled 2023-08-13: qty 10, 10d supply, fill #0

## 2023-08-13 MED ORDER — LABETALOL HCL 200 MG PO TABS
200.0000 mg | ORAL_TABLET | Freq: Three times a day (TID) | ORAL | 3 refills | Status: AC
  Filled 2023-08-13: qty 90, 30d supply, fill #0

## 2023-08-13 NOTE — Patient Instructions (Signed)

## 2023-08-13 NOTE — Progress Notes (Unsigned)
 Subjective:     Sheri Stout is a 38 y.o. female who presents for a postpartum visit. She is 2 weeks postpartum following a Primary LTCS/BTL  at 36w4. Pt on Amlodopine for hypertension. Her daughter is bottlefeeding. Pt was discharged home on a Sunday and had to start back to work on Monday. 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 She reports bilateral lower extremity edema. Bottlefeeding.  The delivery was at 36 gestational weeks.  . Postpartum course has been complicated by hypertension. Baby's course has been uneventful. Baby is feeding by formula. Bleeding staining only. Bowel function is normal. Bladder function is normal. Patient is not sexually active. Contraception method is  BTL . Postpartum depression screening: {neg default:13464}.  {Common ambulatory SmartLinks:19316}  Review of Systems {ros; complete:30496}   Objective:    BP (!) 147/97   Pulse 93   Ht 5\' 6"  (1.676 m)   Wt 226 lb 12.8 oz (102.9 kg)   SpO2 100%   Breastfeeding No   BMI 36.61 kg/m   General:  {gen appearance:16600}   Breasts:  {breast exam:1202::"inspection negative, no nipple discharge or bleeding, no masses or nodularity palpable"}  Lungs: {lung exam:16931}  Heart:  {heart exam:5510}  Abdomen: {abdomen exam:16834}   Vulva:  {labia exam:12198}  Vagina: {vagina exam:12200}  Cervix:  {cervix exam:14595}  Corpus: {uterus exam:12215}  Adnexa:  {adnexa exam:12223}  Rectal Exam: {rectal/vaginal exam:12274}        Assessment:    *** postpartum exam. Pap smear {done:10129} at today's visit.   Plan:    1. Contraception: {method:5051} 2. *** 3. Follow up in: {1-10:13787} {time; units:19136} or as needed.

## 2023-08-14 ENCOUNTER — Other Ambulatory Visit (HOSPITAL_BASED_OUTPATIENT_CLINIC_OR_DEPARTMENT_OTHER): Payer: Self-pay

## 2023-08-20 ENCOUNTER — Ambulatory Visit (HOSPITAL_BASED_OUTPATIENT_CLINIC_OR_DEPARTMENT_OTHER): Payer: Medicaid Other | Admitting: Certified Nurse Midwife

## 2023-08-20 ENCOUNTER — Encounter (HOSPITAL_BASED_OUTPATIENT_CLINIC_OR_DEPARTMENT_OTHER): Payer: Self-pay

## 2023-09-25 ENCOUNTER — Other Ambulatory Visit (HOSPITAL_BASED_OUTPATIENT_CLINIC_OR_DEPARTMENT_OTHER): Payer: Self-pay

## 2023-09-27 ENCOUNTER — Ambulatory Visit (HOSPITAL_BASED_OUTPATIENT_CLINIC_OR_DEPARTMENT_OTHER): Payer: Medicaid Other | Admitting: Certified Nurse Midwife

## 2023-10-01 ENCOUNTER — Ambulatory Visit (HOSPITAL_BASED_OUTPATIENT_CLINIC_OR_DEPARTMENT_OTHER): Admitting: Certified Nurse Midwife

## 2023-10-08 ENCOUNTER — Ambulatory Visit (HOSPITAL_BASED_OUTPATIENT_CLINIC_OR_DEPARTMENT_OTHER): Admitting: Certified Nurse Midwife

## 2023-10-09 ENCOUNTER — Ambulatory Visit (HOSPITAL_BASED_OUTPATIENT_CLINIC_OR_DEPARTMENT_OTHER): Admitting: Certified Nurse Midwife

## 2023-10-09 ENCOUNTER — Encounter (HOSPITAL_BASED_OUTPATIENT_CLINIC_OR_DEPARTMENT_OTHER): Payer: Self-pay | Admitting: Certified Nurse Midwife

## 2023-10-09 ENCOUNTER — Other Ambulatory Visit (HOSPITAL_COMMUNITY)
Admission: RE | Admit: 2023-10-09 | Discharge: 2023-10-09 | Disposition: A | Discharge status: Home / Self Care | Source: Ambulatory Visit | Attending: Certified Nurse Midwife | Admitting: Certified Nurse Midwife

## 2023-10-09 VITALS — BP 125/83 | HR 75 | Ht 66.0 in | Wt 217.0 lb

## 2023-10-09 DIAGNOSIS — Z758 Other problems related to medical facilities and other health care: Secondary | ICD-10-CM

## 2023-10-09 DIAGNOSIS — Z124 Encounter for screening for malignant neoplasm of cervix: Secondary | ICD-10-CM

## 2023-10-09 MED ORDER — BUTALBITAL-APAP-CAFFEINE 50-325-40 MG PO TABS: 2.0000 | ORAL_TABLET | Freq: Two times a day (BID) | ORAL | 0 refills | Status: AC | PRN

## 2023-10-11 LAB — CYTOLOGY - PAP
Chlamydia: NEGATIVE
Comment: NEGATIVE
Comment: NEGATIVE
Comment: NEGATIVE
Comment: NORMAL
Diagnosis: NEGATIVE
High risk HPV: NEGATIVE
Neisseria Gonorrhea: NEGATIVE
Trichomonas: NEGATIVE

## 2023-10-11 NOTE — Progress Notes (Signed)
  Subjective:     Sheri Stout is a 38 y.o. female who presents for a postpartum visit. She is 8 weeks postpartum following a CS. I have fully reviewed the prenatal and intrapartum course. The delivery was at 36 gestational weeks.\ Anesthesia: spinal. Postpartum course has been uneventful. Baby's course has been uneventful. Baby is feeding by  formula . Bleeding no bleeding. Bowel function is normal. Bladder function is normal. Patient is sexually active. Contraception method is BTL.  Postpartum depression screening: negative.  The following portions of the patient's history were reviewed and updated as appropriate: allergies, current medications, past family history, past medical history, past social history, past surgical history, and problem list.  Review of Systems Pertinent items are noted in HPI.   Objective:    BP 125/83 (BP Location: Right Arm, Patient Position: Sitting, Cuff Size: Large)   Pulse 75   Ht 5\' 6"  (1.676 m) Comment: Reported  Wt 217 lb (98.4 kg)   LMP 10/09/2023   Breastfeeding No   BMI 35.02 kg/m   General:  alert and cooperative   Breasts:  inspection negative, no nipple discharge or bleeding, no masses or nodularity palpable  Lungs:   Heart:    Abdomen: soft, non-tender; bowel sounds normal; no masses,  no organomegaly   Vulva:  normal  Vagina: normal vagina, no discharge, exudate, lesion, or erythema  Cervix:  no lesions  Corpus: normal size, contour, position, consistency, mobility, non-tender  Adnexa:  no mass, fullness, tenderness  Rectal Exam: Not performed.        Assessment:    [id number] postpartum exam. Pap smear done at today's visit.   Plan:    1. Contraception:  Bilateral tubal ligation 2. Continue PNV 3. Follow up in: 1  year   or as needed.

## 2023-12-26 ENCOUNTER — Ambulatory Visit (HOSPITAL_BASED_OUTPATIENT_CLINIC_OR_DEPARTMENT_OTHER): Admitting: Family Medicine

## 2024-02-21 ENCOUNTER — Ambulatory Visit: Payer: Self-pay

## 2024-02-21 NOTE — Telephone Encounter (Signed)
 FYI Only or Action Required?: FYI only for provider.  Patient was last seen in primary care on N/A, new patient.  Called Nurse Triage reporting Headache, Blurred Vision, and Nausea.  Symptoms began several months ago.  Interventions attempted: OTC medications: Tylenol , OTC pain reliever.  Symptoms are: headaches, blurred vision, nausea, vomiting phlegm, dizziness, hypertension, loss of appetitegradually worsening in the morning when she wakes up.  Triage Disposition: Go to ED Now (or PCP Triage)  Patient/caregiver understands and will follow disposition?: Yes               Copied from CRM #[id number]. Topic: Clinical - Red Word Triage >> Feb 21, 2024 11:47 AM Avram MATSU wrote: Red Word that prompted transfer to Nurse Triage: blurred vision,headaches, gaging, throw up sometimes.    ----------------------------------------------------------------------- From previous Reason for Contact - Scheduling: Patient/patient representative is calling to schedule an appointment. Refer to attachments for appointment information. Reason for Disposition  Patient sounds very sick or weak to the triager  Answer Assessment - Initial Assessment Questions She states she was hospitalized and diagnosed with preeclampsia after having her baby in February. She states she was placed on blood pressure and diuretic medications and then was taken off and told to follow up with establishing PCP. She missed the appointment in July due to her new job.   1. LOCATION: Where does it hurt?      Sides of head.  2. ONSET: When did the headache start? (e.g., minutes, hours, days)      Since February 5th, after delivering her baby.   3. PATTERN: Does the pain come and go, or has it been constant since it started?     Comes and goes, relieves after taking the medication.  4. SEVERITY: How bad is the pain? and What does it keep you from doing?  (e.g., Scale 1-10; mild, moderate, or severe)     No  pain at this time, she states she took OTC extra strength pain reliever.  5. RECURRENT SYMPTOM: Have you ever had headaches before? If Yes, ask: When was the last time? and What happened that time?      No.  6. CAUSE: What do you think is causing the headache?     She is unsure if it is related to her blood pressure.  7. MIGRAINE: Have you been diagnosed with migraine headaches? If Yes, ask: Is this headache similar?      No.  8. HEAD INJURY: Has there been any recent injury to your head?      No.  9. OTHER SYMPTOMS: Do you have any other symptoms? (e.g., fever, stiff neck, eye pain, sore throat, cold symptoms)     Nausea, loss of appetite, gagging/vomiting phlegm in the morning when she wakes up around 0500, dizziness, blurred vision. She states she also had COVID about 2 weeks ago. Patient checked her BP while on phone with RN and states BP is 159/83 and HR 99. Denies eye pain, changes in speech, unilateral numbness or weakness, chest pain, or SOB.  10. PREGNANCY: Is there any chance you are pregnant? When was your last menstrual period?       LMP: 2 days ago, states it was 18 days early.  Protocols used: [facility]

## 2024-03-21 ENCOUNTER — Other Ambulatory Visit (HOSPITAL_COMMUNITY): Payer: Self-pay

## 2024-05-06 ENCOUNTER — Ambulatory Visit (HOSPITAL_BASED_OUTPATIENT_CLINIC_OR_DEPARTMENT_OTHER): Admitting: Family Medicine

## 2024-07-21 ENCOUNTER — Ambulatory Visit (HOSPITAL_BASED_OUTPATIENT_CLINIC_OR_DEPARTMENT_OTHER): Admitting: Family Medicine

## 2024-07-28 ENCOUNTER — Ambulatory Visit (HOSPITAL_BASED_OUTPATIENT_CLINIC_OR_DEPARTMENT_OTHER): Admitting: Family Medicine
